# Patient Record
Sex: Male | Born: 1965 | Race: White | Hispanic: No | Marital: Married | Smoking: Never smoker
Health system: Southern US, Community
[De-identification: ages and names within clinical notes are randomized; demographics above are authoritative.]

---

## 2004-11-28 ENCOUNTER — Emergency Department (HOSPITAL_COMMUNITY): Admission: EM | Admit: 2004-11-28 | Discharge: 2004-11-29 | Payer: Self-pay | Admitting: *Deleted

## 2005-09-03 ENCOUNTER — Emergency Department (HOSPITAL_COMMUNITY): Admission: EM | Admit: 2005-09-03 | Discharge: 2005-09-03 | Payer: Self-pay | Admitting: Family Medicine

## 2009-09-13 ENCOUNTER — Emergency Department: Payer: Self-pay | Admitting: Emergency Medicine

## 2009-09-17 ENCOUNTER — Inpatient Hospital Stay: Payer: Self-pay | Admitting: Specialist

## 2009-10-24 ENCOUNTER — Ambulatory Visit: Payer: Self-pay | Admitting: Gastroenterology

## 2009-12-10 ENCOUNTER — Ambulatory Visit: Payer: Self-pay | Admitting: Surgery

## 2009-12-17 ENCOUNTER — Inpatient Hospital Stay: Payer: Self-pay | Admitting: Surgery

## 2009-12-19 LAB — PATHOLOGY REPORT

## 2009-12-25 ENCOUNTER — Inpatient Hospital Stay: Payer: Self-pay | Admitting: Surgery

## 2010-05-20 ENCOUNTER — Ambulatory Visit: Payer: Self-pay | Admitting: Orthopedic Surgery

## 2010-07-09 ENCOUNTER — Ambulatory Visit: Payer: Self-pay | Admitting: Orthopedic Surgery

## 2010-07-11 ENCOUNTER — Ambulatory Visit: Payer: Self-pay | Admitting: Orthopedic Surgery

## 2011-06-03 IMAGING — CT CT ABD-PELV W/O CM
1 of 2 series · 15 of 32 positions shown, 19 images · non-contrast
Comparison: none

REASON FOR EXAM: (1) pain L side with hx diverticulitis and ventral
hernia; (2) pain
COMMENTS:   May transport without cardiac monitor

[Series 2: stone · axial · 0.82mm/px · z∈[-586,-67]mm · 15 of 189 slices shown, 19 images]
[im 8/189  soft-tissue]
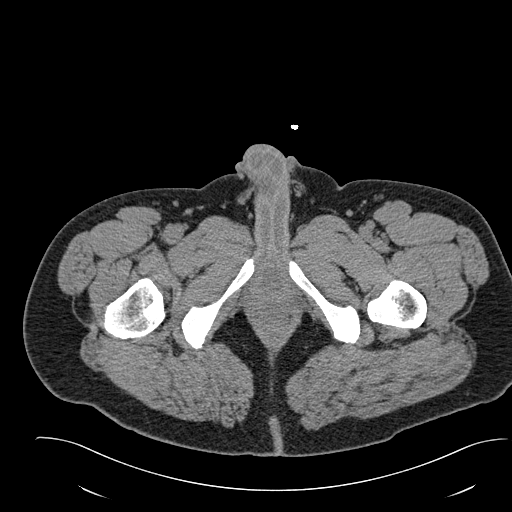
[im 8/189  bone]
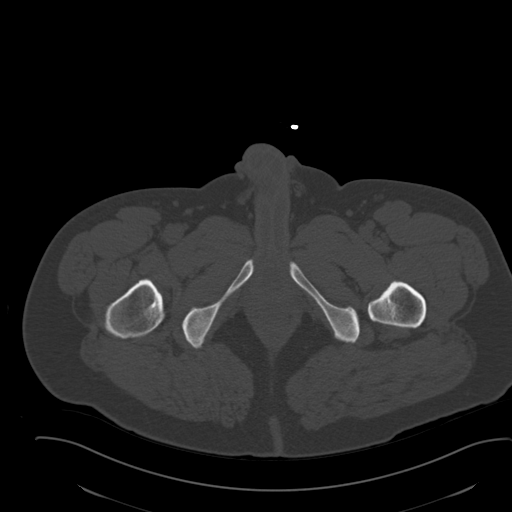
[im 22/189  soft-tissue]
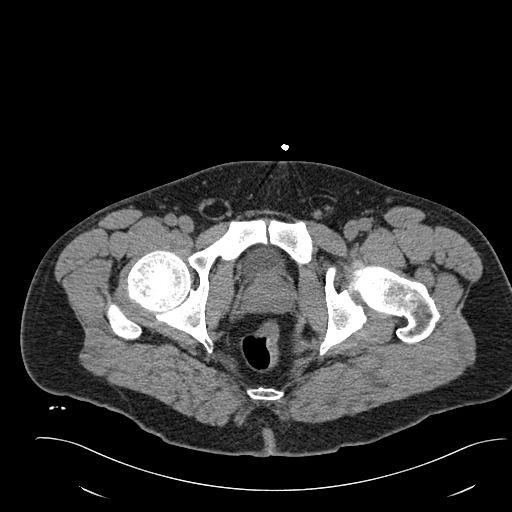
[im 37/189  soft-tissue]
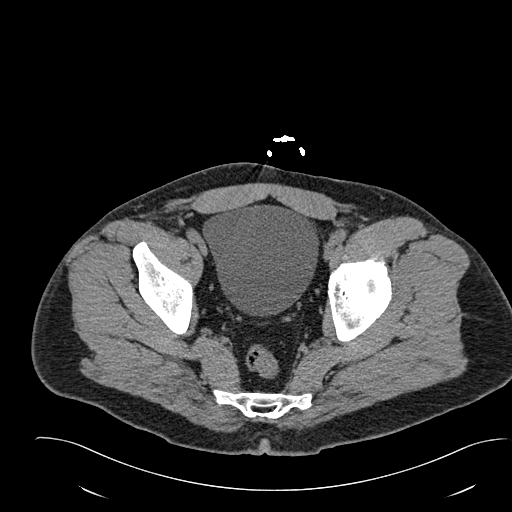
[im 51/189  soft-tissue]
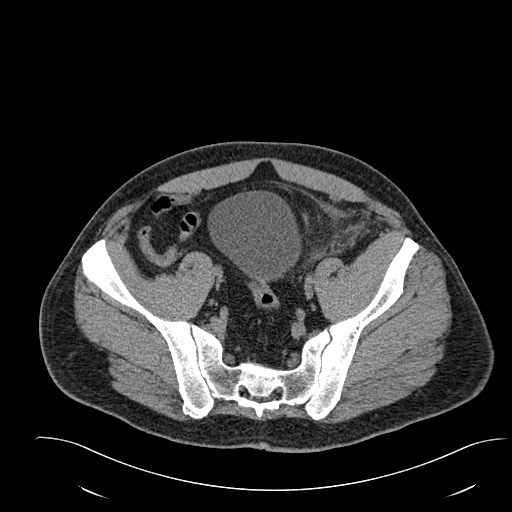
[im 66/189  soft-tissue]
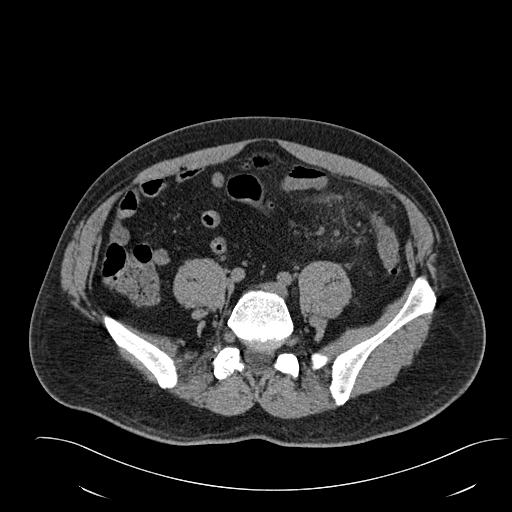
[im 80/189  soft-tissue]
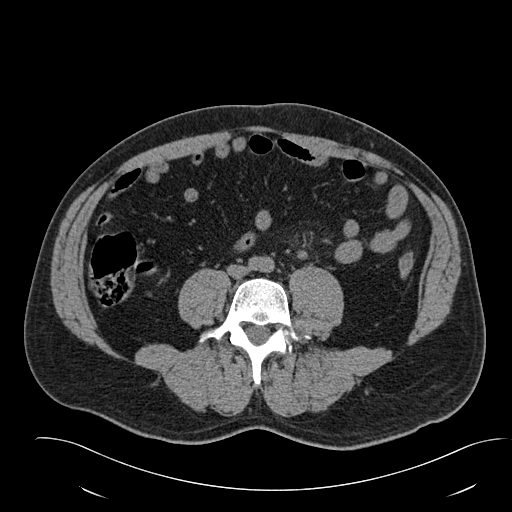
[im 95/189  soft-tissue]
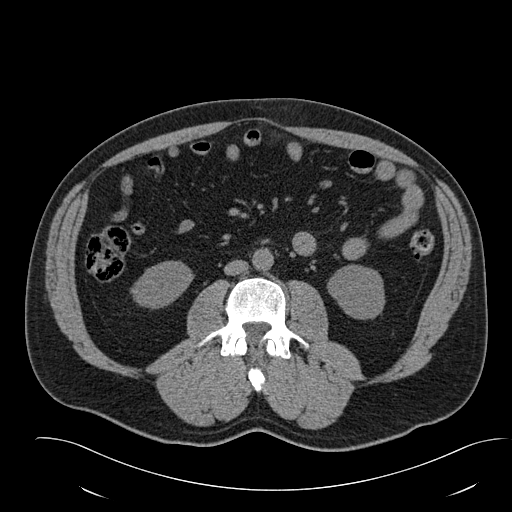
[im 109/189  soft-tissue]
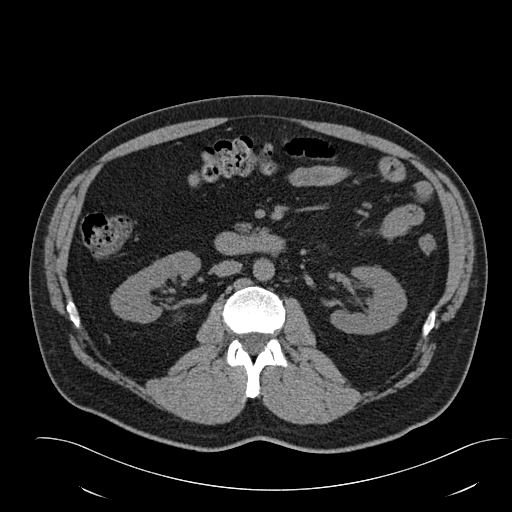
[im 123/189  soft-tissue]
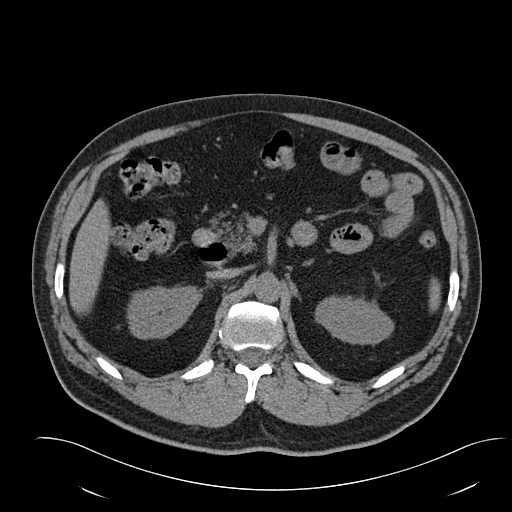
[im 123/189  bone]
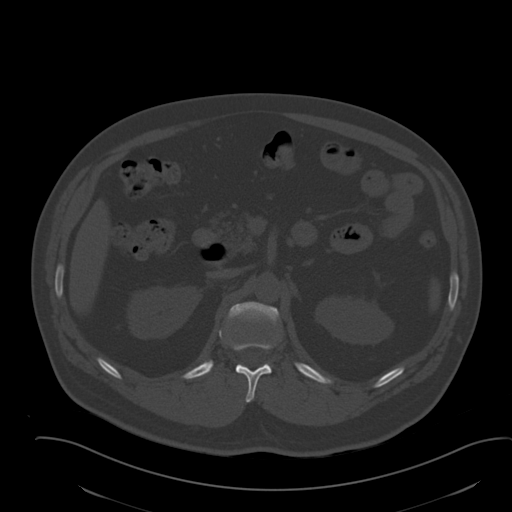
[im 138/189  soft-tissue]
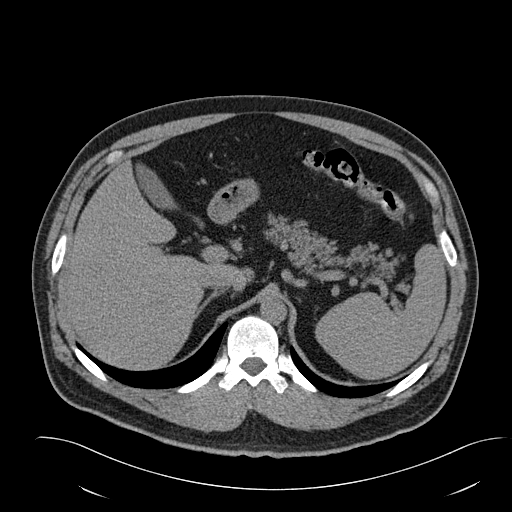
[im 152/189  soft-tissue]
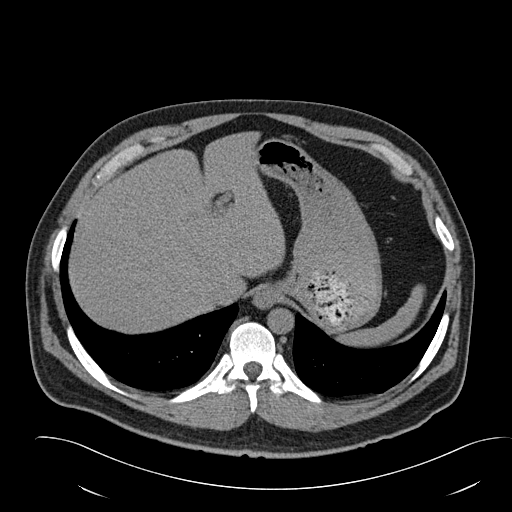
[im 160/189  lung]
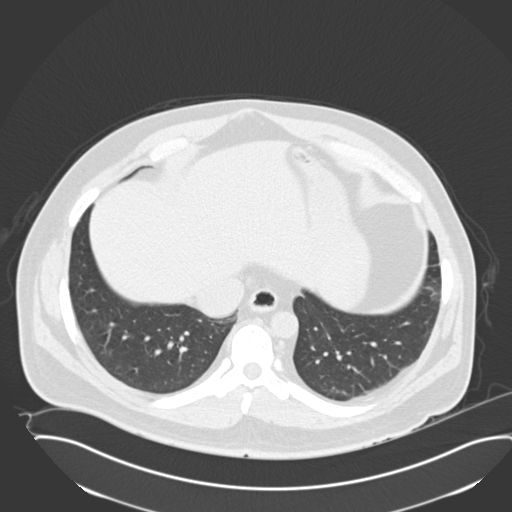
[im 167/189  soft-tissue]
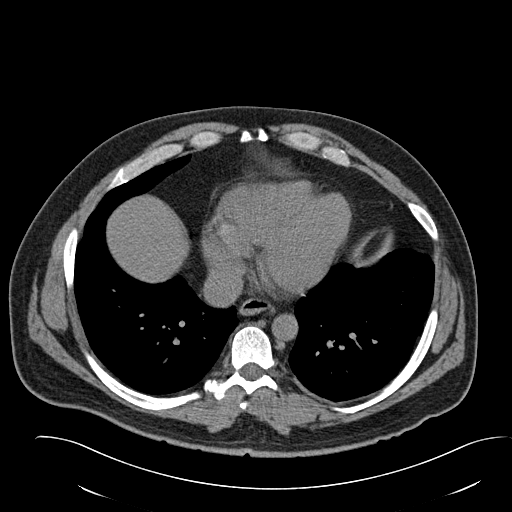
[im 167/189  lung]
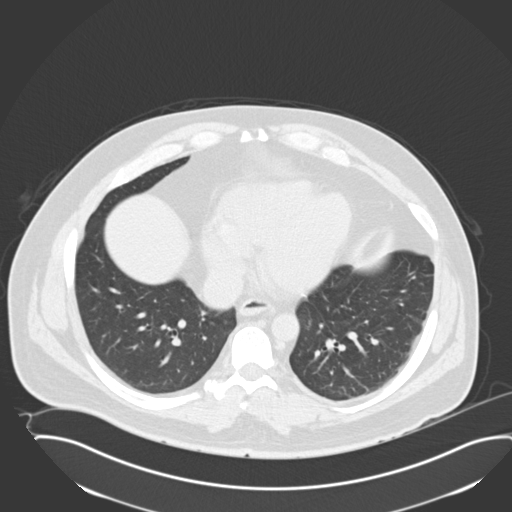
[im 174/189  lung]
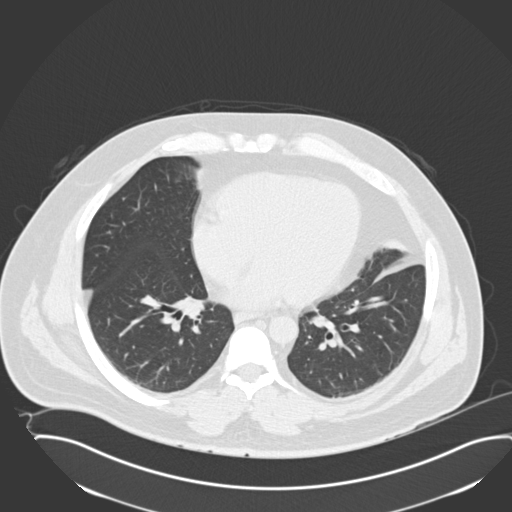
[im 181/189  soft-tissue]
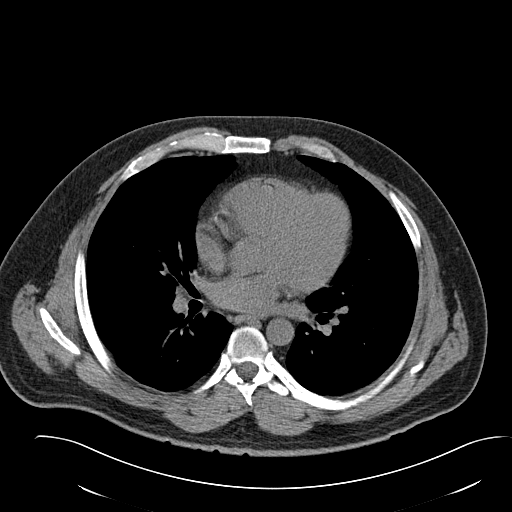
[im 181/189  lung]
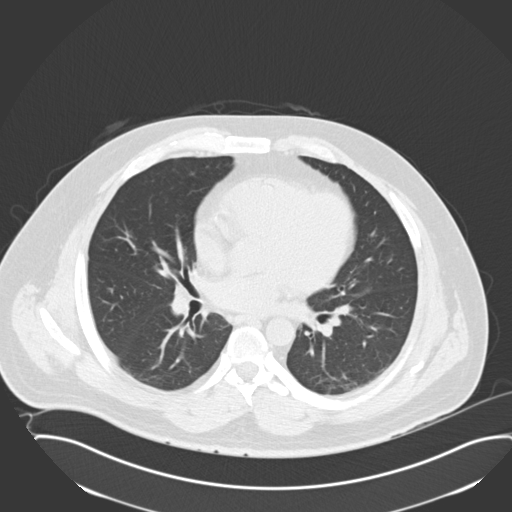

[15 of 32 positions shown; findings below may reference images not displayed]

PROCEDURE:     CT  - CT ABDOMEN AND PELVIS W[DATE] [DATE]

RESULT:     Noncontrast CT of the abdomen and pelvis is performed
emergently. There is no previous exam for comparison.

There is extensive diverticulosis in the distal descending colon and sigmoid
region with a significant amount of pericolonic inflammatory stranding and
thickening of the wall of the sigmoid and ascending colon region in the left
lower quadrant. No gross free air is seen. Microscopic perforation cannot be
excluded. No abscess is evident. No significant ascites is demonstrated. The
urinary bladder, prostate, seminal vesicles, appendix, aorta, liver, spleen,
pancreas, gallbladder, adrenal glands and kidneys appear unremarkable for a
noncontrast exam.
IMPRESSION: 1. Acute diverticulitis at the junction of the descending colon with the
sigmoid colon. There is pericolonic inflammatory stranding with thickening
of the wall. Focal colitis is felt less likely. Malignancy is felt unlikely
but not completely excluded. Clinical followup and laboratory followup is
recommended.

## 2011-08-14 ENCOUNTER — Encounter (HOSPITAL_COMMUNITY): Payer: Self-pay

## 2011-08-14 ENCOUNTER — Emergency Department (INDEPENDENT_AMBULATORY_CARE_PROVIDER_SITE_OTHER)
Admission: EM | Admit: 2011-08-14 | Discharge: 2011-08-14 | Disposition: A | Discharge status: Home Health | Payer: 59 | Source: Home / Self Care | Attending: Emergency Medicine | Admitting: Emergency Medicine

## 2011-08-14 DIAGNOSIS — S61419A Laceration without foreign body of unspecified hand, initial encounter: Secondary | ICD-10-CM

## 2011-08-14 DIAGNOSIS — S61409A Unspecified open wound of unspecified hand, initial encounter: Secondary | ICD-10-CM

## 2011-08-14 DIAGNOSIS — W268XXA Contact with other sharp object(s), not elsewhere classified, initial encounter: Secondary | ICD-10-CM

## 2011-08-14 MED ORDER — TETANUS-DIPHTH-ACELL PERTUSSIS 5-2.5-18.5 LF-MCG/0.5 IM SUSP
INTRAMUSCULAR | Status: AC
  Filled 2011-08-14: qty 0.5

## 2011-08-14 MED ORDER — IBUPROFEN 800 MG PO TABS: 800.0000 mg | ORAL_TABLET | Freq: Three times a day (TID) | ORAL | Status: AC | PRN

## 2011-08-14 MED ORDER — TETANUS-DIPHTH-ACELL PERTUSSIS 5-2.5-18.5 LF-MCG/0.5 IM SUSP
0.5000 mL | Freq: Once | INTRAMUSCULAR | Status: AC
  Administered 2011-08-14: 0.5 mL via INTRAMUSCULAR

## 2011-08-14 NOTE — ED Notes (Signed)
Wound cleaned with CHG sponge/brush.  Rinsed and patted dry

## 2011-08-14 NOTE — ED Provider Notes (Signed)
History     CSN: [id number]  Arrival date & time 08/14/11  1428   First MD Initiated Contact with Patient 08/14/11 1506      Chief Complaint  Patient presents with  . Laceration    (Consider location/radiation/quality/duration/timing/severity/associated sxs/prior treatment) HPI Comments: Patient is left-handed male who reports presently stabbing himself in the right palm with a pair of scissors while trying to open a package yesterday. No foreign body sensation, numbness, tingling, weakness, redness, swelling, purulent drainage, fever. Came in for tetanus shot.  Patient is a 46 y.o. male presenting with skin laceration. The history is provided by the patient. No language interpreter was used.  Laceration  The incident occurred 12 to 24 hours ago. The laceration is located on the right hand. Size: 0.5. He reports no foreign bodies present. His tetanus status is unknown.    History reviewed. No pertinent past medical history.  History reviewed. No pertinent past surgical history.  No family history on file.  History  Substance Use Topics  . Smoking status: Never Smoker   . Smokeless tobacco: Not on file  . Alcohol Use: No      Review of Systems  Constitutional: Negative for fever.  Gastrointestinal: Negative for nausea and vomiting.  Musculoskeletal: Negative for arthralgias.  Skin: Positive for wound.  Neurological: Negative for weakness.    Allergies  Review of patient's allergies indicates no known allergies.  Home Medications   Current Outpatient Rx  Name Route Sig Dispense Refill  . IBUPROFEN 800 MG PO TABS Oral Take 1 tablet (800 mg total) by mouth every 8 (eight) hours as needed for pain. 30 tablet 0    BP 123/81  Pulse 78  Temp(Src) 98.6 F (37 C) (Oral)  Resp 16  SpO2 96%  Physical Exam  Nursing note and vitals reviewed. Constitutional: He is oriented to person, place, and time. He appears well-developed and well-nourished.  HENT:  Head:  Normocephalic and atraumatic.  Eyes: Conjunctivae and EOM are normal.  Neck: Normal range of motion.  Cardiovascular: Normal rate.   Pulmonary/Chest: Effort normal. No respiratory distress.  Abdominal: He exhibits no distension.  Musculoskeletal: Normal range of motion.       Right hand: Decreased strength noted. He exhibits no finger abduction, no thumb/finger opposition and no wrist extension trouble.       Hands:      0.5 cm superficial laceration on the thenar eminence. Painless AROM thumb. Strength 5/5, sensation grossly intact. No redness, swelling around wound. No foreign bodies noted.   Neurological: He is alert and oriented to person, place, and time.  Skin: Skin is warm and dry.  Psychiatric: He has a normal mood and affect. His behavior is normal.    ED Course  LACERATION REPAIR Date/Time: 08/14/2011 3:53 PM Performed by: Luiz Blare Authorized by: Luiz Blare Consent: Verbal consent obtained. Risks and benefits: risks, benefits and alternatives were discussed Consent given by: patient Patient understanding: patient states understanding of the procedure being performed Patient consent: the patient's understanding of the procedure matches consent given Procedure consent: procedure consent matches procedure scheduled Relevant documents: relevant documents not present or verified Test results: test results not available Required items: required blood products, implants, devices, and special equipment available Patient identity confirmed: verbally with patient Time out: Immediately prior to procedure a "time out" was called to verify the correct patient, procedure, equipment, support staff and site/side marked as required. Location: Right palm. Laceration length: 0.5 cm Tendon involvement: none Nerve  involvement: none Vascular damage: no Patient sedated: no Irrigation solution: tap water Irrigation method: Chlorhexidine scrub. Amount of cleaning:  extensive Debridement: none Degree of undermining: none Skin closure: Steri-Strips Approximation: close Approximation difficulty: simple Dressing: pressure dressing and 4x4 sterile gauze Patient tolerance: Patient tolerated the procedure well with no immediate complications.   (including critical care time)  Labs Reviewed - No data to display No results found.   1. Laceration of skin of palm       MDM  Patient has superficial laceration of palm of his hand. No signs of infection, vascular involvement. Had patient scrub this with chlorhexidine scrub. Steri-Stripped this close, placed sterile pressure dressing.  Luiz Blare, MD 08/14/11 1600

## 2011-08-14 NOTE — Discharge Instructions (Signed)
Keep this bandaged for the next 2 days. Let the Steri-Strips fall off on their own. Return if you have any signs of infection such as increasing pain, purulent drainage, redness streaking up her wrist, fevers above 100.4, or for any other concerns.

## 2011-08-14 NOTE — ED Notes (Signed)
Pt was attempting to open package with scissors and accidentally stabbed himself in his rt hand.  Injury was last night at 730 pm.

## 2011-09-16 IMAGING — CR DG ABDOMEN 2V
1 series · 3 of 3 positions shown · non-contrast
Comparison: none

REASON FOR EXAM: recent vomiting, recent sigmoid colectomy
COMMENTS:

[Series 1: view not recorded · 0.17mm/px · 3 of 3 slices shown]
[im 1/3]
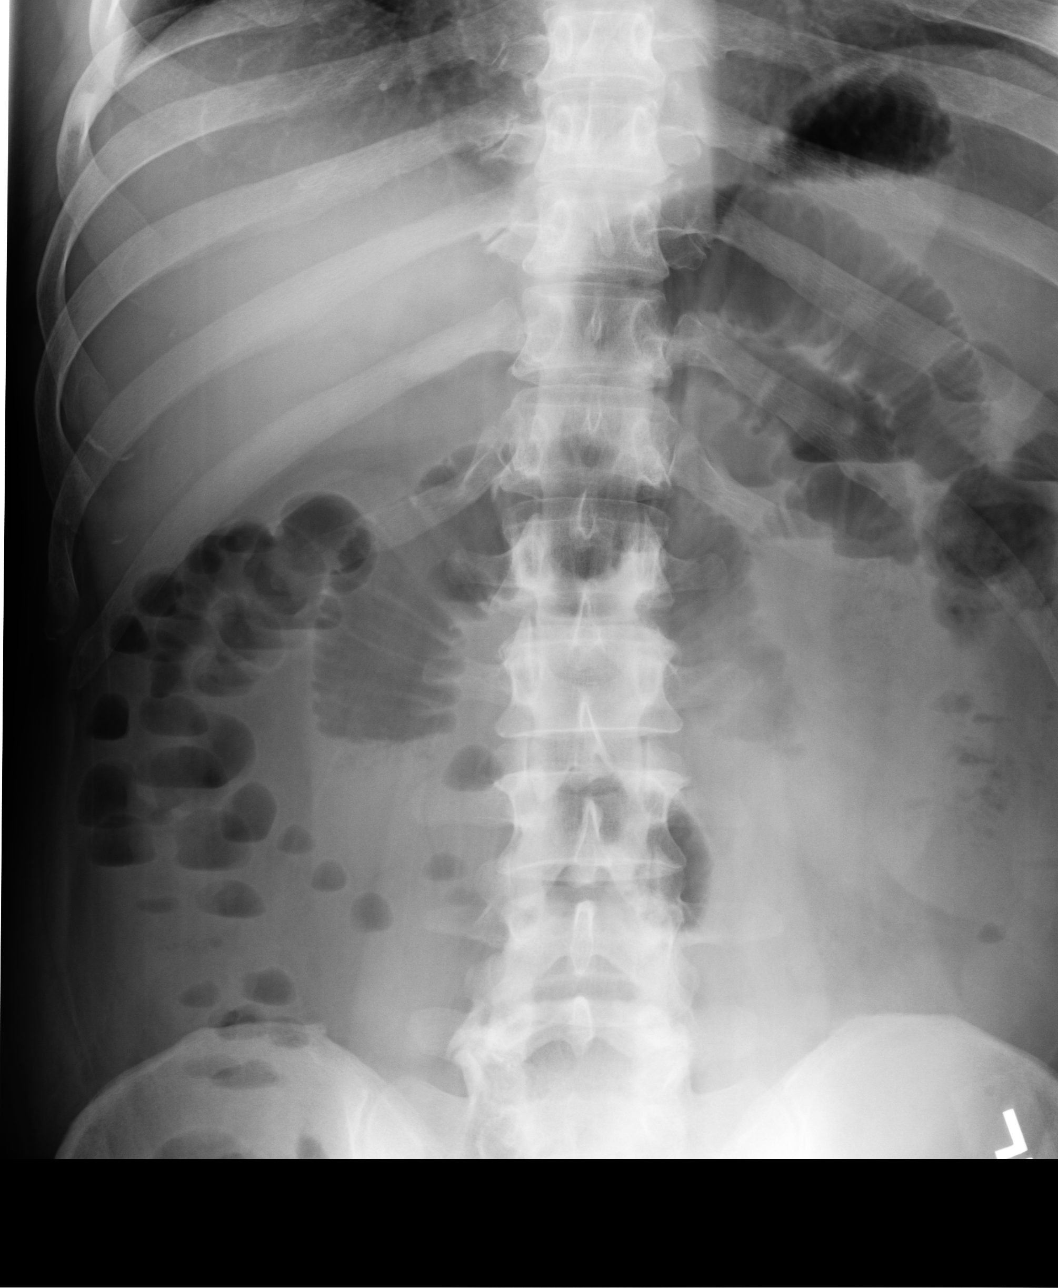
[im 2/3]
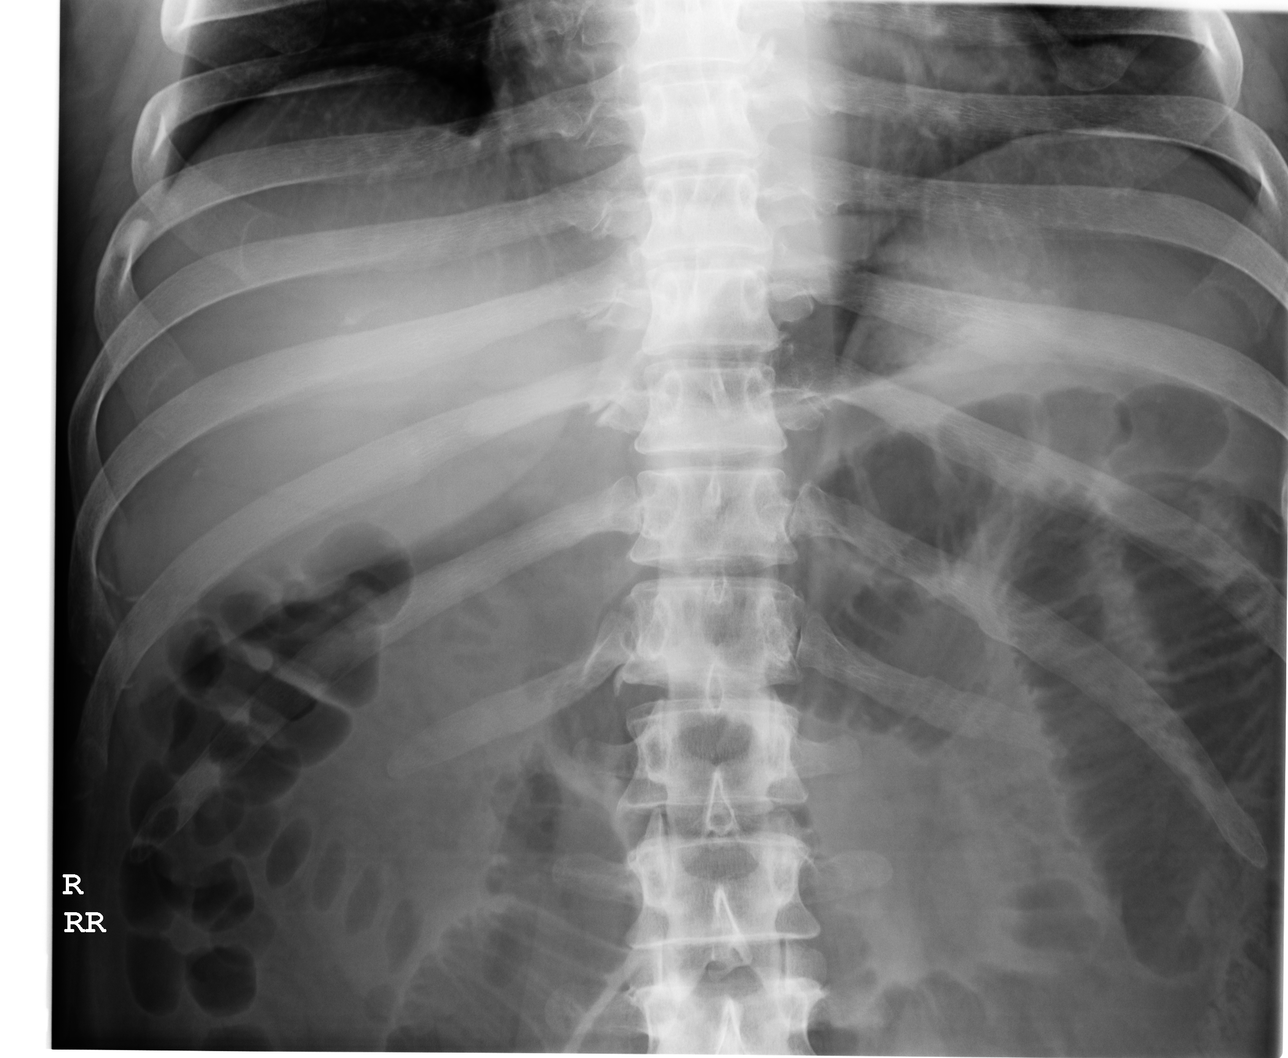
[im 3/3]
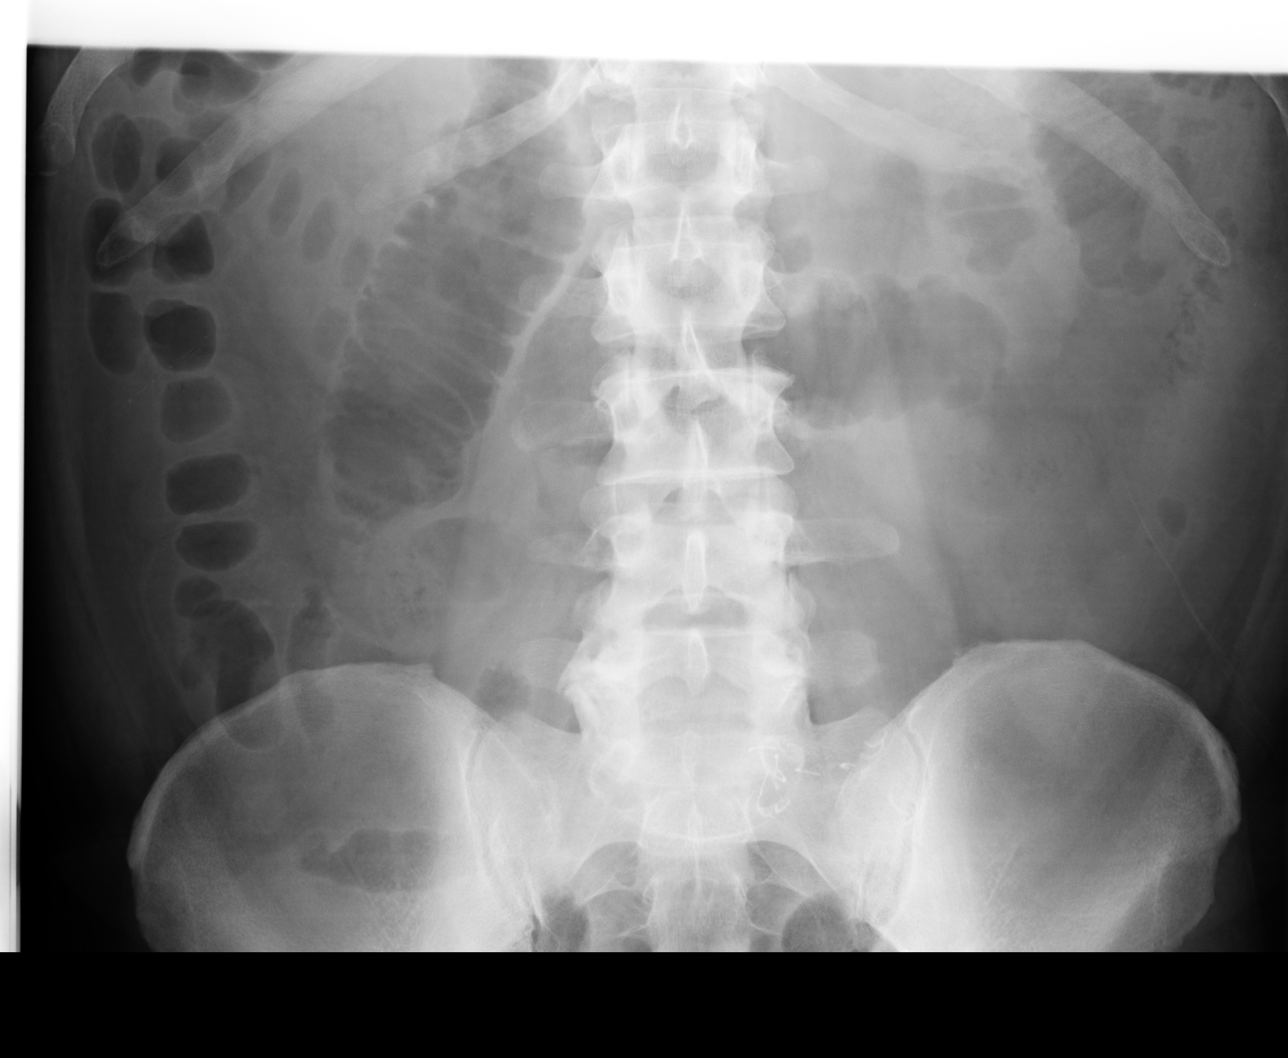

[3 of 3 positions shown; findings below may reference images not displayed]

PROCEDURE:     DXR - DXR ABDOMEN 2 V FLAT AND ERECT  - December 27, 2009  [DATE]

RESULT:     Comparison is made to the prior exam of 12/25/2009.

There persist multiple dilated loops of small bowel containing air/fluid
levels. The degree of bowel dilatation appears to be mildly improved. No
progressive dilatation is seen. Gas is visualized in the ascending and
proximal transverse colon with no colonic dilatation evident. The findings
remain consistent with an ileus pattern although partial small bowel
obstruction still cannot be excluded from the differential. No abnormal
intra-abdominal calcifications are seen. The osseous structures are normal
in appearance.
IMPRESSION: There persist multiple dilated loops of small bowel
containing air/fluid levels with there being slightly less distention of
bowel loops than was evident on the exam of 12/25/2009. Findings remain
compatible with an ileus pattern although partial small bowel obstruction
cannot be excluded from the differential.

## 2011-09-28 ENCOUNTER — Emergency Department: Payer: Self-pay | Admitting: *Deleted

## 2011-09-28 LAB — COMPREHENSIVE METABOLIC PANEL
BUN: 15 mg/dL (ref 7–18)
Bilirubin,Total: 0.4 mg/dL (ref 0.2–1.0)
Calcium, Total: 8.7 mg/dL (ref 8.5–10.1)
Co2: 27 mmol/L (ref 21–32)
Creatinine: 0.81 mg/dL (ref 0.60–1.30)
EGFR (African American): >60
Glucose: 101 mg/dL — ABNORMAL HIGH (ref 65–99)
SGOT(AST): 23 U/L (ref 15–37)
SGPT (ALT): 33 U/L
Sodium: 141 mmol/L (ref 136–145)

## 2011-09-28 LAB — CBC
HCT: 45.7 % (ref 40.0–52.0)
MCHC: 33.2 g/dL (ref 32.0–36.0)
RDW: 13.1 % (ref 11.5–14.5)
WBC: 11.3 10*3/uL — ABNORMAL HIGH (ref 3.8–10.6)

## 2012-07-14 ENCOUNTER — Emergency Department (HOSPITAL_COMMUNITY)
Admission: EM | Admit: 2012-07-14 | Discharge: 2012-07-14 | Disposition: A | Discharge status: Home / Self Care | Payer: Worker's Compensation | Attending: Emergency Medicine | Admitting: Emergency Medicine

## 2012-07-14 ENCOUNTER — Encounter (HOSPITAL_COMMUNITY): Payer: Self-pay | Admitting: *Deleted

## 2012-07-14 DIAGNOSIS — Y92410 Unspecified street and highway as the place of occurrence of the external cause: Secondary | ICD-10-CM | POA: Insufficient documentation

## 2012-07-14 DIAGNOSIS — S0990XA Unspecified injury of head, initial encounter: Secondary | ICD-10-CM | POA: Insufficient documentation

## 2012-07-14 DIAGNOSIS — Y9389 Activity, other specified: Secondary | ICD-10-CM | POA: Insufficient documentation

## 2012-07-14 MED ORDER — METHOCARBAMOL 500 MG PO TABS: 500.0000 mg | ORAL_TABLET | Freq: Two times a day (BID) | ORAL | Status: AC

## 2012-07-14 MED ORDER — IBUPROFEN 800 MG PO TABS
800.0000 mg | ORAL_TABLET | Freq: Once | ORAL | Status: AC
Start: 2012-07-14 — End: 2012-07-14
  Administered 2012-07-14: 800 mg via ORAL
  Filled 2012-07-14: qty 1

## 2012-07-14 MED ORDER — METHOCARBAMOL 500 MG PO TABS
500.0000 mg | ORAL_TABLET | Freq: Once | ORAL | Status: AC
  Administered 2012-07-14: 500 mg via ORAL
  Filled 2012-07-14: qty 1

## 2012-07-14 MED ORDER — IBUPROFEN 800 MG PO TABS: 800.0000 mg | ORAL_TABLET | Freq: Three times a day (TID) | ORAL | Status: AC
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
## 2012-07-14 NOTE — ED Notes (Signed)
Pt verbalizes understanding 
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
## 2012-07-14 NOTE — ED Notes (Signed)
Bed:WA23<BR> Expected date:<BR> Expected time:<BR> Means of arrival:<BR> Comments:<BR> ems

## 2012-07-15 NOTE — ED Provider Notes (Signed)
History/physical exam/procedure(s) were performed by non-physician practitioner and as supervising physician I was immediately available for consultation/collaboration. I have reviewed all notes and am in agreement with care and plan.   Hilario Quarry, MD 07/15/12 0000

## 2013-10-15 ENCOUNTER — Inpatient Hospital Stay: Payer: Self-pay | Admitting: Internal Medicine

## 2013-10-15 LAB — CBC WITH DIFFERENTIAL/PLATELET
BANDS NEUTROPHIL: 2 %
EOS PCT: 4 %
HCT: 53.8 % — AB (ref 40.0–52.0)
HGB: 18.5 g/dL — AB (ref 13.0–18.0)
LYMPHS PCT: 10 %
MCH: 28.4 pg (ref 26.0–34.0)
MCHC: 34.3 g/dL (ref 32.0–36.0)
MCV: 83 fL (ref 80–100)
Monocytes: 6 %
PLATELETS: 361 10*3/uL (ref 150–440)
RBC: 6.5 10*6/uL — AB (ref 4.40–5.90)
RDW: 13.7 % (ref 11.5–14.5)
Segmented Neutrophils: 78 %
WBC: 28.5 10*3/uL — AB (ref 3.8–10.6)

## 2013-10-15 LAB — COMPREHENSIVE METABOLIC PANEL
ALBUMIN: 3.1 g/dL — AB (ref 3.4–5.0)
ALK PHOS: 129 U/L — AB
ANION GAP: 13 (ref 7–16)
AST: 9 U/L — AB (ref 15–37)
BUN: 8 mg/dL (ref 7–18)
Bilirubin,Total: 0.4 mg/dL (ref 0.2–1.0)
CHLORIDE: 110 mmol/L — AB (ref 98–107)
CREATININE: 1.2 mg/dL (ref 0.60–1.30)
Calcium, Total: 8.7 mg/dL (ref 8.5–10.1)
Co2: 16 mmol/L — ABNORMAL LOW (ref 21–32)
Glucose: 131 mg/dL — ABNORMAL HIGH (ref 65–99)
Osmolality: 278 (ref 275–301)
Potassium: 2.8 mmol/L — ABNORMAL LOW (ref 3.5–5.1)
SGPT (ALT): 23 U/L (ref 12–78)
Sodium: 139 mmol/L (ref 136–145)
TOTAL PROTEIN: 7.6 g/dL (ref 6.4–8.2)

## 2013-10-15 LAB — URINALYSIS, COMPLETE
BACTERIA: NONE SEEN
BILIRUBIN, UR: NEGATIVE
GLUCOSE, UR: NEGATIVE mg/dL (ref 0–75)
Hyaline Cast: 25
Leukocyte Esterase: NEGATIVE
Nitrite: NEGATIVE
Ph: 5 (ref 4.5–8.0)
SPECIFIC GRAVITY: 1.028 (ref 1.003–1.030)

## 2013-10-15 LAB — MAGNESIUM: Magnesium: 1.4 mg/dL — ABNORMAL LOW

## 2013-10-15 LAB — CLOSTRIDIUM DIFFICILE(ARMC)

## 2013-10-15 LAB — LIPASE, BLOOD: Lipase: 79 U/L (ref 73–393)

## 2013-10-16 LAB — CBC WITH DIFFERENTIAL/PLATELET
Basophil #: 0.1 10*3/uL (ref 0.0–0.1)
Basophil %: 0.4 %
EOS ABS: 3.6 10*3/uL — AB (ref 0.0–0.7)
Eosinophil %: 17.5 %
HCT: 42.2 % (ref 40.0–52.0)
HGB: 14.3 g/dL (ref 13.0–18.0)
LYMPHS PCT: 13.9 %
Lymphocyte #: 2.8 10*3/uL (ref 1.0–3.6)
MCH: 27.8 pg (ref 26.0–34.0)
MCHC: 33.8 g/dL (ref 32.0–36.0)
MCV: 82 fL (ref 80–100)
MONOS PCT: 9.2 %
Monocyte #: 1.9 x10 3/mm — ABNORMAL HIGH (ref 0.2–1.0)
NEUTROS PCT: 59 %
Neutrophil #: 12 10*3/uL — ABNORMAL HIGH (ref 1.4–6.5)
Platelet: 233 10*3/uL (ref 150–440)
RBC: 5.14 10*6/uL (ref 4.40–5.90)
RDW: 13.9 % (ref 11.5–14.5)
WBC: 20.3 10*3/uL — AB (ref 3.8–10.6)

## 2013-10-16 LAB — BASIC METABOLIC PANEL
ANION GAP: 6 — AB (ref 7–16)
BUN: 5 mg/dL — AB (ref 7–18)
CHLORIDE: 109 mmol/L — AB (ref 98–107)
Calcium, Total: 7.8 mg/dL — ABNORMAL LOW (ref 8.5–10.1)
Co2: 26 mmol/L (ref 21–32)
Creatinine: 1.23 mg/dL (ref 0.60–1.30)
EGFR (African American): >60
EGFR (Non-African Amer.): >60
Glucose: 98 mg/dL (ref 65–99)
Osmolality: 278 (ref 275–301)
Potassium: 3.3 mmol/L — ABNORMAL LOW (ref 3.5–5.1)
Sodium: 141 mmol/L (ref 136–145)

## 2013-10-16 LAB — WBCS, STOOL

## 2013-10-16 LAB — MAGNESIUM: MAGNESIUM: 1.8 mg/dL

## 2013-10-17 LAB — BASIC METABOLIC PANEL
ANION GAP: 6 — AB (ref 7–16)
BUN: 8 mg/dL (ref 7–18)
Calcium, Total: 7.8 mg/dL — ABNORMAL LOW (ref 8.5–10.1)
Chloride: 111 mmol/L — ABNORMAL HIGH (ref 98–107)
Co2: 24 mmol/L (ref 21–32)
Creatinine: 0.97 mg/dL (ref 0.60–1.30)
EGFR (African American): >60
Glucose: 104 mg/dL — ABNORMAL HIGH (ref 65–99)
OSMOLALITY: 280 (ref 275–301)
Potassium: 3.1 mmol/L — ABNORMAL LOW (ref 3.5–5.1)
Sodium: 141 mmol/L (ref 136–145)

## 2013-10-17 LAB — CBC WITH DIFFERENTIAL/PLATELET
BASOS ABS: 0.1 10*3/uL (ref 0.0–0.1)
BASOS PCT: 0.3 %
EOS ABS: 3.9 10*3/uL — AB (ref 0.0–0.7)
Eosinophil %: 21.4 %
HCT: 41.9 % (ref 40.0–52.0)
HGB: 14.2 g/dL (ref 13.0–18.0)
Lymphocyte #: 3.3 10*3/uL (ref 1.0–3.6)
Lymphocyte %: 18.1 %
MCH: 27.9 pg (ref 26.0–34.0)
MCHC: 33.8 g/dL (ref 32.0–36.0)
MCV: 83 fL (ref 80–100)
Monocyte #: 1.4 x10 3/mm — ABNORMAL HIGH (ref 0.2–1.0)
Monocyte %: 7.5 %
Neutrophil #: 9.5 10*3/uL — ABNORMAL HIGH (ref 1.4–6.5)
Neutrophil %: 52.7 %
Platelet: 223 10*3/uL (ref 150–440)
RBC: 5.07 10*6/uL (ref 4.40–5.90)
RDW: 14 % (ref 11.5–14.5)
WBC: 18 10*3/uL — AB (ref 3.8–10.6)

## 2013-10-17 LAB — STOOL CULTURE

## 2013-10-18 LAB — CBC WITH DIFFERENTIAL/PLATELET
Basophil: 4 %
COMMENT - H1-COM1: NORMAL
Eosinophil: 9 %
HCT: 44.1 % (ref 40.0–52.0)
HGB: 15 g/dL (ref 13.0–18.0)
LYMPHS PCT: 24 %
MCH: 28.3 pg (ref 26.0–34.0)
MCHC: 34 g/dL (ref 32.0–36.0)
MCV: 83 fL (ref 80–100)
Monocytes: 4 %
Platelet: 227 10*3/uL (ref 150–440)
RBC: 5.29 10*6/uL (ref 4.40–5.90)
RDW: 14 % (ref 11.5–14.5)
Segmented Neutrophils: 59 %
WBC: 15.8 10*3/uL — AB (ref 3.8–10.6)

## 2013-10-18 LAB — BASIC METABOLIC PANEL
Anion Gap: 4 — ABNORMAL LOW (ref 7–16)
BUN: 7 mg/dL (ref 7–18)
CHLORIDE: 108 mmol/L — AB (ref 98–107)
CREATININE: 0.9 mg/dL (ref 0.60–1.30)
Calcium, Total: 8.2 mg/dL — ABNORMAL LOW (ref 8.5–10.1)
Co2: 27 mmol/L (ref 21–32)
EGFR (Non-African Amer.): >60
GLUCOSE: 95 mg/dL (ref 65–99)
Osmolality: 275 (ref 275–301)
Potassium: 3.6 mmol/L (ref 3.5–5.1)
SODIUM: 139 mmol/L (ref 136–145)

## 2013-10-20 LAB — CULTURE, BLOOD (SINGLE)

## 2013-11-15 ENCOUNTER — Other Ambulatory Visit: Payer: Self-pay | Admitting: Gastroenterology

## 2013-11-15 LAB — CLOSTRIDIUM DIFFICILE(ARMC)

## 2013-11-17 LAB — STOOL CULTURE

## 2013-12-08 ENCOUNTER — Ambulatory Visit: Payer: Self-pay | Admitting: Gastroenterology

## 2013-12-08 LAB — CLOSTRIDIUM DIFFICILE(ARMC)

## 2013-12-10 LAB — STOOL CULTURE

## 2013-12-11 LAB — PATHOLOGY REPORT

## 2014-02-06 ENCOUNTER — Ambulatory Visit: Payer: Self-pay

## 2014-06-28 ENCOUNTER — Ambulatory Visit (INDEPENDENT_AMBULATORY_CARE_PROVIDER_SITE_OTHER): Payer: 59 | Admitting: Podiatry

## 2014-06-28 ENCOUNTER — Ambulatory Visit (INDEPENDENT_AMBULATORY_CARE_PROVIDER_SITE_OTHER): Payer: 59

## 2014-06-28 VITALS — BP 128/90 | HR 82 | Resp 16 | Ht 72.0 in | Wt 220.0 lb

## 2014-06-28 DIAGNOSIS — R52 Pain, unspecified: Secondary | ICD-10-CM

## 2014-06-28 DIAGNOSIS — M216X9 Other acquired deformities of unspecified foot: Secondary | ICD-10-CM

## 2014-06-28 DIAGNOSIS — Q667 Congenital pes cavus: Secondary | ICD-10-CM

## 2014-06-28 DIAGNOSIS — G600 Hereditary motor and sensory neuropathy: Secondary | ICD-10-CM

## 2014-06-28 NOTE — Progress Notes (Signed)
   Subjective:    Patient ID: Willie Levy, male    DOB: Nov 13, 1965, 49 y.o.   MRN: [id number]  HPI Comments: Right foot pain , surgery in 2003 for deformities in the foot  , hammertoes and high arches. Callus on the foot. The surgery did not work . 6 months of pain for nothing .   charcoat  marie tooth .   Foot Pain      Review of Systems  All other systems reviewed and are negative.      Objective:   Physical Exam: I have reviewed all past medical history medications allergies surgeries social history review of systems. Pulses are strongly palpable bilateral. Neurologic sensorium is intact percent is once the monofilament. Deep tendon reflexes are intact bilateral and muscle strength +5 over 5 dorsiflexion plantar flexors and inverters and everters all intrinsic musculature is intact. 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 Right appears to be worse than the left. Right has had previous surgery. Cutaneous evaluation demonstrates supple well-hydrated cutis with the exception of reactive hyperkeratosis plantar aspect of the fifth metatarsal of the right foot primarily.        Assessment & Plan:  Assessment: Pain in limb secondary to painful porokeratosis and corn plantar aspect of the right fifth metatarsophalangeal joint. 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  Plan: He was scanned for set of orthotics today and I debrided all reactive hyperkeratosis.

## 2014-07-17 ENCOUNTER — Ambulatory Visit: Payer: Self-pay | Admitting: Surgery

## 2014-07-17 LAB — CBC WITH DIFFERENTIAL/PLATELET
BASOS ABS: 0.1 10*3/uL (ref 0.0–0.1)
BASOS PCT: 0.9 %
EOS ABS: 0.2 10*3/uL (ref 0.0–0.7)
Eosinophil %: 1.9 %
HCT: 45.4 % (ref 40.0–52.0)
HGB: 15.3 g/dL (ref 13.0–18.0)
Lymphocyte #: 2.4 10*3/uL (ref 1.0–3.6)
Lymphocyte %: 25 %
MCH: 28.7 pg (ref 26.0–34.0)
MCHC: 33.8 g/dL (ref 32.0–36.0)
MCV: 85 fL (ref 80–100)
MONO ABS: 0.9 x10 3/mm (ref 0.2–1.0)
Monocyte %: 9.6 %
NEUTROS PCT: 62.6 %
Neutrophil #: 6 10*3/uL (ref 1.4–6.5)
Platelet: 280 10*3/uL (ref 150–440)
RBC: 5.35 10*6/uL (ref 4.40–5.90)
RDW: 13.3 % (ref 11.5–14.5)
WBC: 9.5 10*3/uL (ref 3.8–10.6)

## 2014-07-17 LAB — BASIC METABOLIC PANEL
ANION GAP: 8 (ref 7–16)
BUN: 14 mg/dL (ref 7–18)
CO2: 27 mmol/L (ref 21–32)
Calcium, Total: 8.2 mg/dL — ABNORMAL LOW (ref 8.5–10.1)
Chloride: 106 mmol/L (ref 98–107)
Creatinine: 1.08 mg/dL (ref 0.60–1.30)
EGFR (Non-African Amer.): >60
GLUCOSE: 93 mg/dL (ref 65–99)
OSMOLALITY: 281 (ref 275–301)
Potassium: 4 mmol/L (ref 3.5–5.1)
SODIUM: 141 mmol/L (ref 136–145)

## 2014-07-20 ENCOUNTER — Ambulatory Visit: Payer: 59 | Admitting: *Deleted

## 2014-07-20 DIAGNOSIS — R52 Pain, unspecified: Secondary | ICD-10-CM

## 2014-07-20 NOTE — Patient Instructions (Signed)

## 2014-07-20 NOTE — Progress Notes (Signed)
PICKING UP INSERTS  

## 2014-07-24 ENCOUNTER — Ambulatory Visit: Payer: Self-pay | Admitting: Surgery
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
## 2014-10-06 NOTE — Consult Note (Signed)
Chief Complaint:  Subjective/Chief Complaint seen for mesenteritis.  denies abd pain, continues with loose stool. no nausea, appetite good.   VITAL SIGNS/ANCILLARY NOTES: **Vital Signs.:   07-May-15 08:19  Vital Signs Type Q 8hr  Temperature Temperature (F) 97.7  Celsius 36.5  Pulse Pulse 65  Respirations Respirations 17  Systolic BP Systolic BP 133  Diastolic BP (mmHg) Diastolic BP (mmHg) 89  Mean BP 103  Pulse Ox % Pulse Ox % 98  Pulse Ox Activity Level  At rest  Oxygen Delivery Room Air/ 21 %   Brief Assessment:  Cardiac Regular   Respiratory clear BS   Gastrointestinal details normal Soft  Nontender  Nondistended  Bowel sounds normal   Assessment/Plan:  Assessment/Plan:  Assessment 1) diarrheal illness/mesenteritis-improving, still some loose stool.  finish 10 day course of abx, adding probiotic /daily yogurt ok.  Recommend GI fu in 2-3 weeks and colonoscopy in a month.   Plan as above, signing off.   Electronic Signatures: Barnetta ChapelSkulskie, Martin (MD)  (Signed 07-May-15 11:29)  Authored: Chief Complaint, VITAL SIGNS/ANCILLARY NOTES, Brief Assessment, Assessment/Plan   Last Updated: 07-May-15 11:29 by Barnetta ChapelSkulskie, Martin (MD)

## 2014-10-06 NOTE — Consult Note (Signed)
Brief Consult Note: Diagnosis: Gastroenteritis.   Patient was seen by consultant.   Consult note dictated.   Comments: Appreciate consult for 49 y/o caucasian man admitted with gastroenteritis. GI history significant for frequent diverticulitis requiring sigmoid colectomy 2011. Had colonoscopy 2011 with 2 polyps, one adenomatous. 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 States 2w ago, when his sx started, he was on duty- [place] [employer]. Later that evening his stomach started churning: about 0200 nvd began- however was well enough to go to a course in [place] a few days later. Sx increased dramatically- [facility]- reports having CT and being treated with antiemetics/antidiarrheals. Sx better x 1d, then increased last Friday and Saturday dramatically. States the diarrhea was watery w/ urgency, too many stools to count. Abd sore but not painful. No reports of hematemesis and bloody/black/tarry stools.  Since his arrival here, has been treated with IVF/electrolytes, Cipro/Flagyl due to dehydration, hypokalemia, and leukocytosis.  Feeling better presently: 3 loose watery stools today. No NV since Sun. No abd soreness. States no other recent abx, changes, pets, unusual water source prior to symptoms. Impression and plan: Gastroenteritis. Likely infectious and improving on current. Do recommend probiotics once daily. Given his history of colon polyps and recent symptoms, should have cspy soon, may be able to set this up as o/p depending on clinical situation- would continue abx for a 10d course.  Electronic Signatures: Vevelyn PatLondon, Ventura Hollenbeck H (NP)  (Signed 06-May-15 16:05)  Authored: Brief Consult Note   Last Updated: 06-May-15 16:05 by Keturah BarreLondon, Nihar Klus H (NP)

## 2014-10-06 NOTE — Discharge Summary (Signed)
PATIENT NAME:  Willie Levy, Willie Levy MR#:  [id number] DATE OF BIRTH:  21-May-1966  DATE OF ADMISSION:  10/15/2013 DATE OF DISCHARGE:  10/19/2013  DISCHARGE DIAGNOSIS:  Systemic inflammatory response syndrome, present on admission, treated, felt secondary to acute gastroenteritis a DISCHARGE MEDICATIONS:  Cipro 500 mg twice a day for 10 days and  flagyl  500 mg  by mouth every eight hours for 10 days.   CONSULTATIONS:  GI consult with Dr. Marva PandaSkulskie.   HOSPITAL COURSE:  The patient is a 49 year old male with no past medical history, comes in because of nausea, vomiting, diarrhea, and abdominal pain going on for 10 days.  Look at the history and physical for full details.  The patient's diarrhea and vomiting did not get resolved and he tried some Phenergan and also Imodium.  The patient's white count was 28.7 on admission.  [facility]  At that time, it was 22,000 and the patient's white count was elevated to 28.5 and the patient also had temperature of 100.5 with heart rate 120 on admission so he is admitted for SIRS secondary to gastroenteritis with evidence of low-grade temp and tachycardia and leukocytosis.  We admitted him to hospitalist service, started on Cipro.  The patient's stool cultures were sent for C. diff., ova and parasites.  The patient also started on IV fluids.  The patient's stool C. diff. were negative for ova and parasites, however CAT scan of the abdomen showed small mesenteric lymph nodes and mesenteric adenitis and the patient's blood cultures have been negative and white count nicely improved gradually with fluids and antibiotics.  Persistently had diarrhea, but nausea, vomiting have resolved, tolerated the diet.  The patient's diarrhea did not get better so I have added the Flagyl and I asked gastroenterology consult and Dr. Marva PandaSkulskie has seen the patient.  The patient needs to have outpatient colonoscopy and GI recommended 10 days course of antibiotics and also  add probiotic to his regimen and follow up with GI in 2 to 3 weeks for colonoscopy.  The patient was given Cipro, Flagyl.  I advised him to continue probiotic.  I gave a prescription for lactobacillus and the patient needs to take those once and follow up with GI as an outpatient for colonoscopy.  The patient also had hypokalemia which was replaced in IV fluids.  The patient's potassium was low as 3.1 and white count at the time of discharge 15.8 and that was labs from yesterday.  The patient's hemoglobin 15, hematocrit 44.1.  PHYSICAL EXAMINATION:  VITAL SIGNS:  Today, temperature 97.7, heart rate 65, blood pressure 130/89, sats 95%on room air.  GENERAL:  He is alert, awake, oriented, not in distress. HEENT:  Head atraumatic, normocephalic.  Eyes, pupils equal, reacting to light.  Extraocular movements intact.  EARS, NOSE, THROAT:  No tympanic membrane congestion.  No turbinate hypertrophy.  No oropharyngeal erythema.  NECK:  Supple.  No  jvd  The patient is stable for discharge.   Time spent on discharge preparation more than 30 minutes.    ____________________________ Katha HammingSnehalatha Medha Pippen, MD sk:ea D: 10/19/2013 13:44:03 ET T: 10/20/2013 00:26:18 ET JOB#: [id number]  cc: Katha HammingSnehalatha Rubert Frediani, MD, <Dictator> Katha HammingSNEHALATHA Marrell Dicaprio MD ELECTRONICALLY SIGNED 11/02/2013 10:15

## 2014-10-06 NOTE — Consult Note (Signed)
Chief Complaint:  Subjective/Chief Complaint Please see full GI consult and brief consult note.  Patietn seen and examined.  Patient admitted with diarrheal illness, n/v.  Currently feeling some better after several days of antibiotics, also with improving wbc.  CT showing possible mild mesenteric adenitis.    Continue  antibiotics, change to oral.  Will need o/p GI follow up and plan to do colonoscopy in about 3-4 weeks.   Following.   VITAL SIGNS/ANCILLARY NOTES: **Vital Signs.:   06-May-15 16:10  Vital Signs Type Q 8hr  Temperature Temperature (F) 98.2  Celsius 36.7  Pulse Pulse 69  Respirations Respirations 18  Systolic BP Systolic BP 145  Diastolic BP (mmHg) Diastolic BP (mmHg) 88  Mean BP 107  Pulse Ox % Pulse Ox % 96  Pulse Ox Activity Level  At rest  Oxygen Delivery Room Air/ 21 %   Brief Assessment:  Cardiac Regular   Respiratory clear BS   Gastrointestinal details normal Soft  Nontender  Nondistended  No masses palpable   EXTR negative cyanosis/clubbing   Electronic Signatures: Barnetta ChapelSkulskie, Vallery Mcdade (MD)  (Signed 06-May-15 17:41)  Authored: Chief Complaint, VITAL SIGNS/ANCILLARY NOTES, Brief Assessment   Last Updated: 06-May-15 17:41 by Barnetta ChapelSkulskie, Bryar Dahms (MD)

## 2014-10-06 NOTE — H&P (Signed)
PATIENT NAME:  Willie Levy, BEBOUT MR#:  [id number] DATE OF BIRTH:  1965-10-10  DATE OF ADMISSION:  10/15/2013  PRIMARY CARE PHYSICIAN:  Demetrios Isaacs. Fisher, MD  CHIEF COMPLAINT: Intractable nausea, vomiting and diarrhea, abdominal pain.   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 He presents to the ER with over 10 days of abdominal soreness, nausea, vomiting and diarrhea. He has had extreme diarrhea, too many times to count. He has been trying to hydrate with Gatorade. The stool is the color of the Gatorade usually. He has had extreme vomiting, unable to keep anything down, even ice chips today, vomited. Vomitus is all bile, no blood in the vomit. Symptoms have waxed and waned over the period of the 10 days. He has had some joint discomfort, low-grade fever, some chills. He was in the ER on Thursday in [place], they gave him Lomotil, Phenergan and Benadryl. His white count at that time was 22,000 and his potassium was 2.9 as per the patient. In the ER today, his white count was up at 28.5. He had toxic granulation. His potassium was low at 2.8. Hospitalist services were contacted for further evaluation. CAT scan showed mesenteric adenitis.   PAST MEDICAL HISTORY: Charcot-Marie-Tooth disease, diverticulitis, anxiety.   PAST SURGICAL HISTORY: Colectomy, knee surgery and ankle tendon transplant.   ALLERGIES: No known drug allergies.   MEDICATIONS: Include Celexa 20 mg daily.   SOCIAL HISTORY: Works with the police department. No smoking. No alcohol. No drug use. Lives with wife.   FAMILY HISTORY: Mother has breast cancer, father with diverticulitis and diabetes.   REVIEW OF SYSTEMS:    CONSTITUTIONAL: Positive for fever. Positive for chills. Positive for weight loss over the past few days. Positive for fatigue.  EYES: No blurry vision.  EARS, NOSE, MOUTH AND THROAT: No hearing loss. No sore throat. No difficulty swallowing.  CARDIOVASCULAR: No chest pain.  No palpitations.  RESPIRATORY:  No shortness of breath.  No cough. No sputum. No hemoptysis.  GASTROINTESTINAL: Positive for nausea. Positive for vomiting. No hematemesis. Positive for abdominal pain. Positive for diarrhea. No bright red blood per rectum. No melena.  GENITOURINARY: Urinating less frequently. No hematuria.  MUSCULOSKELETAL: Positive for joint pains all over and the low back.  PSYCHIATRIC: Positive for anxiety.  ENDOCRINE: No thyroid problems.  HEMATOLOGIC AND LYMPHATIC: No anemia.  SKIN: No rashes or ulcers.   PHYSICAL EXAMINATION: VITAL SIGNS: On presentation included a temperature of 100.5, pulse 120, respirations 20, blood pressure 129/89, pulse oximetry 95% on room air.  GENERAL: No respiratory distress.  EYES: Conjunctivae and lids normal. Pupils equal, round and reactive to light. Extraocular muscles intact. No nystagmus.  EARS, NOSE, MOUTH AND THROAT: Tympanic membranes: No erythema. Nasal mucosa: No erythema. Throat: No erythema. No exudate seen. Lips and gums: No lesions.  NECK: No JVD. No bruits. No lymphadenopathy. No thyromegaly. No thyroid nodules palpated.  RESPIRATORY:  Lungs clear to auscultation. No use of accessory muscles to breathe. No rhonchi, rales or wheeze heard.  CARDIOVASCULAR: S1, S2 normal. No gallops, rubs or murmurs heard. Carotid upstroke 2+ bilaterally. No bruits. Dorsalis pedis pulses 2+ bilaterally. No edema of the lower extremity.  ABDOMEN: Soft. Positive tenderness diffusely throughout the entire abdomen. No organomegaly/splenomegaly. Normoactive bowel sounds. No masses felt.  LYMPHATIC: No lymph nodes in the neck.  MUSCULOSKELETAL: No clubbing, edema or cyanosis.  SKIN: No ulcers or lesions seen.  NEUROLOGIC: Cranial nerves II through XII grossly intact. Deep tendon reflexes 1+  bilateral lower extremities.  PSYCHIATRIC: The patient is oriented to person, place and time.   LABORATORY, DIAGNOSTIC AND RADIOLOGICAL DATA:  Lipase 79. Glucose  131, BUN 8, creatinine 1.20, sodium 139, potassium 2.9, chloride 110, CO2 of 16, calcium 8.7. Liver function tests: Alkaline phosphatase 129, ALT 23, AST 9. White blood cell count 28.5, hemoglobin and hematocrit 18.5 and 53.8, platelet count of 361. Urinalysis trace ketones, 1+ blood, hazy in color. Stool for C. difficile negative. Venous pH 7.34, lactic acid 1.3. CT scan of the abdomen and pelvis showed small mesenteric lymph nodes, mesenteric adenitis could present in this fashion, small bilateral inguinal hernias with herniation of fat only.   ASSESSMENT AND PLAN: 1.  Systemic inflammatory response syndrome with gastroenteritis with low-grade temperature,  tachycardia and leukocytosis. The patient has intractable nausea, vomiting and diarrhea. We will send stool for culture, ova and parasite and leukocytes. Current stool for Clostridium difficile is negative. We will give IV Cipro at this point. ER physician gave 1 dose of IV Zosyn. The patient does have toxic granulations on the CBC, will get a differential on this. Will give vigorous IV fluid hydration. Clear liquid diet and continue to monitor.  2.  Hypokalemia. Potassium was 2.8. Will check a magnesium and replace that if that is low also. We will replace potassium in the IV fluids and a bolus.  3.  Anxiety. Continue Celexa.  4.  Charcot-Marie-Tooth syndrome.   TIME SPENT ON ADMISSION: 50 minutes.     ____________________________ Herschell Dimesichard J. Renae GlossWieting, MD rjw:cs D: 10/15/2013 13:35:43 ET T: 10/15/2013 14:52:23 ET JOB#: [id number]  cc: Herschell Dimesichard J. Renae GlossWieting, MD, <Dictator> Demetrios Isaacsonald E. Sherrie MustacheFisher, MD Salley ScarletICHARD J Robin Pafford MD ELECTRONICALLY SIGNED 10/16/2013 10:37

## 2014-10-14 NOTE — Op Note (Signed)
PATIENT NAME:  Willie Levy, Willie Levy MR#:  [id number] DATE OF BIRTH:  Nov 20, 1965  DATE OF PROCEDURE:  07/24/2014  PREOPERATIVE DIAGNOSIS: Ventral hernia.   POSTOPERATIVE DIAGNOSIS: Ventral hernia.   PROCEDURE: Ventral hernia repair.   SURGEON: Adella HareJ. Wilton Zaedyn Covin, MD   ANESTHESIA: General.   INDICATIONS: This 49 year old male reports bulging in the left upper abdomen. A ventral hernia was demonstrated on physical exam, and repair was recommended for definitive treatment.   DESCRIPTION OF PROCEDURE: The patient was placed on the operating table in the supine position under general anesthesia. The abdomen was clipped and prepared with ChloraPrep, draped in a sterile manner.   The site of the hernia could be palpated in the left upper quadrant, which was approximately 2 cm cephalad to the umbilicus and oriented to the left of the midline. A transversely oriented incision was made some 4 cm in length, carried down through subcutaneous tissues. Electrocautery was used for hemostasis. A ventral hernia sac was and was encountered and was dissected away from surrounding tissues. It was dissected down to a fascial ring defect, which was separated from the fascial ring defect. The sac was excised. There was underlying omentum. Properitoneal fat was dissected away from the fascia circumferentially, extending back some 8 mm. Next, a Bard soft mesh was cut to create an oval shape of some 1.4 x 2 cm in dimension. This was placed oriented transversely in the properitoneal plane and sutured to the overlying fascia with through and through 0 Surgilon sutures. Next, the fascial ring defect, which was approximately 1.4 cm in dimension, was closed with a transversely oriented suture line of interrupted 0 Surgilon figure-of-eight sutures, incorporating each suture into the mesh. The repair looked good. The deep fascia and subcutaneous tissues were infiltrated with 0.5% Sensorcaine with epinephrine. Hemostasis was intact. The  superficial fascia was closed with interrupted 4-0 Monocryl. The skin was closed with running 4-0 Monocryl subcuticular suture and LiquiBand. The patient tolerated surgery satisfactorily and was then prepared for transfer to the recovery room.   ____________________________ Shela CommonsJ. Renda RollsWilton Maverik Foot, MD jws:mw D: 07/24/2014 10:09:49 ET T: 07/24/2014 11:22:56 ET JOB#: [id number]  cc: Adella HareJ. Wilton Jasmynn Pfalzgraf, MD, <Dictator> Adella HareWILTON J Karlita Lichtman MD ELECTRONICALLY SIGNED 07/26/2014 15:25
